# Patient Record
Sex: Female | Born: 1935 | Race: Black or African American | Hispanic: No | Smoking: Never smoker
Health system: Southern US, Community
[De-identification: ages and names within clinical notes are randomized; demographics above are authoritative.]

## PROBLEM LIST (undated history)

## (undated) DIAGNOSIS — F32A Depression, unspecified: Secondary | ICD-10-CM

## (undated) DIAGNOSIS — I10 Essential (primary) hypertension: Secondary | ICD-10-CM

## (undated) DIAGNOSIS — G4700 Insomnia, unspecified: Secondary | ICD-10-CM

## (undated) DIAGNOSIS — F329 Major depressive disorder, single episode, unspecified: Secondary | ICD-10-CM

## (undated) HISTORY — PX: ABDOMINAL HYSTERECTOMY: SHX81

## (undated) HISTORY — PX: CORNEAL TRANSPLANT: SHX108

## (undated) HISTORY — PX: TONSILLECTOMY: SUR1361

---

## 2017-05-26 ENCOUNTER — Encounter (HOSPITAL_BASED_OUTPATIENT_CLINIC_OR_DEPARTMENT_OTHER): Payer: Self-pay | Admitting: *Deleted

## 2017-05-26 ENCOUNTER — Emergency Department (HOSPITAL_BASED_OUTPATIENT_CLINIC_OR_DEPARTMENT_OTHER)
Admission: EM | Admit: 2017-05-26 | Discharge: 2017-05-28 | Disposition: A | Discharge status: Other Acute Inpatient Hospital | Payer: Medicare Other | Attending: Emergency Medicine | Admitting: Emergency Medicine

## 2017-05-26 DIAGNOSIS — F22 Delusional disorders: Secondary | ICD-10-CM | POA: Insufficient documentation

## 2017-05-26 DIAGNOSIS — I10 Essential (primary) hypertension: Secondary | ICD-10-CM | POA: Diagnosis not present

## 2017-05-26 DIAGNOSIS — Z79899 Other long term (current) drug therapy: Secondary | ICD-10-CM | POA: Insufficient documentation

## 2017-05-26 DIAGNOSIS — Z7982 Long term (current) use of aspirin: Secondary | ICD-10-CM | POA: Diagnosis not present

## 2017-05-26 DIAGNOSIS — R4182 Altered mental status, unspecified: Secondary | ICD-10-CM | POA: Diagnosis present

## 2017-05-26 HISTORY — DX: Insomnia, unspecified: G47.00

## 2017-05-26 HISTORY — DX: Essential (primary) hypertension: I10

## 2017-05-26 HISTORY — DX: Depression, unspecified: F32.A

## 2017-05-26 HISTORY — DX: Major depressive disorder, single episode, unspecified: F32.9

## 2017-05-26 LAB — CBC WITH DIFFERENTIAL/PLATELET
BASOS ABS: 0 10*3/uL (ref 0.0–0.1)
BASOS PCT: 0 %
Eosinophils Absolute: 0.1 10*3/uL (ref 0.0–0.7)
Eosinophils Relative: 1 %
HEMATOCRIT: 40.7 % (ref 36.0–46.0)
HEMOGLOBIN: 13.9 g/dL (ref 12.0–15.0)
LYMPHS PCT: 36 %
Lymphs Abs: 2.1 10*3/uL (ref 0.7–4.0)
MCH: 32.3 pg (ref 26.0–34.0)
MCHC: 34.2 g/dL (ref 30.0–36.0)
MCV: 94.4 fL (ref 78.0–100.0)
MONO ABS: 0.5 10*3/uL (ref 0.1–1.0)
MONOS PCT: 8 %
NEUTROS ABS: 3.2 10*3/uL (ref 1.7–7.7)
NEUTROS PCT: 55 %
Platelets: 189 10*3/uL (ref 150–400)
RBC: 4.31 MIL/uL (ref 3.87–5.11)
RDW: 13.4 % (ref 11.5–15.5)
WBC: 5.9 10*3/uL (ref 4.0–10.5)

## 2017-05-26 LAB — COMPREHENSIVE METABOLIC PANEL
ALBUMIN: 4.1 g/dL (ref 3.5–5.0)
ALK PHOS: 55 U/L (ref 38–126)
ALT: 10 U/L — ABNORMAL LOW (ref 14–54)
AST: 23 U/L (ref 15–41)
Anion gap: 6 (ref 5–15)
BILIRUBIN TOTAL: 0.7 mg/dL (ref 0.3–1.2)
BUN: 18 mg/dL (ref 6–20)
CALCIUM: 9.2 mg/dL (ref 8.9–10.3)
CO2: 27 mmol/L (ref 22–32)
CREATININE: 0.9 mg/dL (ref 0.44–1.00)
Chloride: 103 mmol/L (ref 101–111)
GFR calc Af Amer: >60 mL/min (ref >60)
GFR, EST NON AFRICAN AMERICAN: 57 mL/min — AB (ref >60)
GLUCOSE: 143 mg/dL — AB (ref 65–99)
POTASSIUM: 3.9 mmol/L (ref 3.5–5.1)
Sodium: 136 mmol/L (ref 135–145)
TOTAL PROTEIN: 7.3 g/dL (ref 6.5–8.1)

## 2017-05-26 LAB — TROPONIN I

## 2017-05-26 NOTE — ED Notes (Signed)
[facility] but unsuccessful

## 2017-05-26 NOTE — ED Triage Notes (Signed)
Daughter states altered mental status, paranoia, unsteady gait and generalized weakness  x 3 days

## 2017-05-27 ENCOUNTER — Emergency Department (HOSPITAL_BASED_OUTPATIENT_CLINIC_OR_DEPARTMENT_OTHER): Payer: Medicare Other

## 2017-05-27 LAB — RAPID URINE DRUG SCREEN, HOSP PERFORMED
AMPHETAMINES: NOT DETECTED
BENZODIAZEPINES: POSITIVE — AB
Barbiturates: NOT DETECTED
COCAINE: NOT DETECTED
OPIATES: NOT DETECTED
Tetrahydrocannabinol: NOT DETECTED

## 2017-05-27 LAB — URINALYSIS, ROUTINE W REFLEX MICROSCOPIC
Bilirubin Urine: NEGATIVE
Glucose, UA: NEGATIVE mg/dL
Hgb urine dipstick: NEGATIVE
Ketones, ur: NEGATIVE mg/dL
LEUKOCYTES UA: NEGATIVE
NITRITE: NEGATIVE
PROTEIN: NEGATIVE mg/dL
SPECIFIC GRAVITY, URINE: 1.015 (ref 1.005–1.030)
pH: 6 (ref 5.0–8.0)

## 2017-05-27 LAB — ETHANOL

## 2017-05-27 NOTE — ED Notes (Signed)
Pt is high risk to elopement.  Pt had ambulated to hallway.  Will place bed alarm

## 2017-05-27 NOTE — Progress Notes (Signed)
Patient meets criteria for inpatient treatment. CSW faxed referrals to the following inpatient facilities for review:  [facility], [facility], [facility], [facility], [facility], [facility], [facility]   TTS will continue to seek bed placement.   Princes Finger MSW, LCSWA CSW Disposition [phone]  

## 2017-05-27 NOTE — BHH Counselor (Signed)
Per Donell SievertSpencer Simon, PA-C: AM Psychriatric Evaluation recommended. Attending Provider, Judd Lienelo, MD notified at [phone].

## 2017-05-27 NOTE — Progress Notes (Signed)
Per Dr.Kumar, the patient meets criteria for inpatient gero-psych treatment. Recommends patient be referred to inpatient gero-psych treatment facilities.   Based on the collateral obtain during the [facility], given by the patient's son, Jarrett AblesClarence Hargett,  the patient has had an increase in anxiety, delusions, and inconsistent sleep patterns.   The patient's son also reports that he has witnessed observable changes in the patient's mental health status.   Patient was voluntarily brought to [facility] by her son Jarrett Ables(Clarence Woodford, [phone]) and daughter Gwynneth Aliment(Kimberly McNeil, [phone]). The patient reportedly lives alone.   TTS/Disposition CSW will seek possible placement for inpatient gero-psych treatment facilities.   Baldo DaubJolan Shirlie Enck MSW, LCSWA CSW Disposition [phone] (office) [phone] (cell)

## 2017-05-27 NOTE — ED Notes (Signed)
[facility] had large soft brown BM

## 2017-05-27 NOTE — ED Notes (Signed)
Patient transported to CT 

## 2017-05-27 NOTE — ED Notes (Signed)
[facility] to see if patient could be moved. Call will be made back to us if they are able to accept pt.
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
## 2017-05-27 NOTE — ED Notes (Signed)
Pt seen wandering in the the hallway. It took two staff members to encourage pt back to the room. Once in the room pt demaned to keep her medications with her in the bed. Her son not currently at the bedside. Pt was assisted back to bed and bed alarm was placed. Son back in room and was made aware of what had just occurred. He now has the package that her home medications are in. Pt agrees now to take daily bp, eye drop and asa pill.

## 2017-05-27 NOTE — ED Provider Notes (Signed)
[facility]-EMERGENCY DEPT [facility] Provider Note   CSN: [id number] Arrival date & time: 05/26/17  2144     History   Chief Complaint Chief Complaint  Patient presents with  . Altered Mental Status    HPI Trina AoMarie Bejarano is a 83 y.o. female.  Patient is an 83 year old female with past mental history of hypertension, depression, and insomnia. She is brought by family for evaluation of mental status change. According to the son and daughter at bedside, she has been paranoid and appears confused. The daughter first noticed this 3-4 days ago when she spoke with her on the phone. The daughter came to check on her and found her to be in the above state. The patient denies to me she is experiencing any pain, shortness of breath, or other symptoms. She does tell me that there is a "scheme in place to get her out of there", but will not elaborate on what this scheme is. She also tells me that she has money and believes that someone is trying to take this from her.  Patient has told the family that she may have taken extra Zoloft to "help her sleep".   The history is provided by the patient.  Altered Mental Status   This is a new problem. Episode onset: 3-4 days ago. The problem has been gradually worsening. Associated symptoms include confusion and delusions. Her past medical history does not include CVA or COPD.    Past Medical History:  Diagnosis Date  . Depression   . Hypertension   . Insomnia     There are no active problems to display for this patient.   Past Surgical History:  Procedure Laterality Date  . ABDOMINAL HYSTERECTOMY    . CORNEAL TRANSPLANT    . TONSILLECTOMY      OB History    No data available       Home Medications    Prior to Admission medications   Medication Sig Start Date End Date Taking? Authorizing Provider  aspirin 81 MG chewable tablet Chew by mouth daily.   Yes [provider]  felodipine (PLENDIL) 5 MG 24 hr tablet Take 5 mg by mouth daily.   Yes  [provider]  LORazepam (ATIVAN) 0.5 MG tablet Take 0.5 mg by mouth 2 (two) times daily.   Yes [provider]  Melatonin 10 MG TABS Take by mouth.   Yes [provider]  prednisoLONE acetate (PRED FORTE) 1 % ophthalmic suspension 1 drop 4 (four) times daily.   Yes [provider]  sertraline (ZOLOFT) 100 MG tablet Take 100 mg by mouth daily.   Yes [provider]    Family History History reviewed. No pertinent family history.  Social History Social History  Substance Use Topics  . Smoking status: Never Smoker  . Smokeless tobacco: Never Used  . Alcohol use No     Allergies   Amoxicillin and Codeine   Review of Systems Review of Systems  Psychiatric/Behavioral: Positive for confusion.  All other systems reviewed and are negative.    Physical Exam Updated Vital Signs BP (!) 144/69   Pulse (!) 53   Temp 98 F (36.7 C) (Oral)   Resp 17   Ht 5\' 7"  (1.702 m)   Wt 77.1 kg (170 lb)   SpO2 97%   BMI 26.63 kg/m   Physical Exam  Constitutional: She is oriented to person, place, and time. She appears well-developed and well-nourished. No distress.  HENT:  Head: Normocephalic and atraumatic.  Mouth/Throat: Oropharynx is clear and moist.  Eyes: Pupils are equal, round, and reactive to light. EOM are normal.  Neck: Normal range of motion. Neck supple.  Cardiovascular: Normal rate and regular rhythm.  Exam reveals no gallop and no friction rub.   No murmur heard. Pulmonary/Chest: Effort normal and breath sounds normal. No respiratory distress. She has no wheezes.  Abdominal: Soft. Bowel sounds are normal. She exhibits no distension. There is no tenderness.  Musculoskeletal: Normal range of motion.  Lymphadenopathy:    She has no cervical adenopathy.  Neurological: She is alert and oriented to person, place, and time. No cranial nerve deficit. She exhibits normal muscle tone. Coordination normal.  Skin: Skin is warm and dry.  She is not diaphoretic.  Nursing note and vitals reviewed.    ED Treatments / Results  Labs (all labs ordered are listed, but only abnormal results are displayed) Labs Reviewed  COMPREHENSIVE METABOLIC PANEL - Abnormal; Notable for the following:       Result Value   Glucose, Bld 143 (*)    ALT 10 (*)    GFR calc non Af Amer 57 (*)    All other components within normal limits  CBC WITH DIFFERENTIAL/PLATELET  TROPONIN I  URINALYSIS, ROUTINE W REFLEX MICROSCOPIC    EKG  EKG Interpretation None       Radiology No results found.  Procedures Procedures (including critical care time)  Medications Ordered in ED Medications - No data to display   Initial Impression / Assessment and Plan / ED Course  I have reviewed the triage vital signs and the nursing notes.  Pertinent labs & imaging results that were available during my care of the patient were reviewed by me and considered in my medical decision making (see chart for details).  Patient brought by family for evaluation of paranoid and delusional behavior as described in the history of present illness. She does have a history of depression and difficulty sleeping. Family also reports that she has had a "nervous breakdown" in the past.  Her medical workup is essentially unremarkable. She is afebrile, has no white count, there is no sign of infection. Laboratory studies are also unremarkable. Head CT is negative and urinalysis is clear. The patient has undergone consultation by TTS who is recommending reevaluation in the morning. She will remain in the emergency department until that time. The final disposition will be left to TTS discretion.  She appears to be medically cleared.  Final Clinical Impressions(s) / ED Diagnoses   Final diagnoses:  None    New Prescriptions New Prescriptions   No medications on file     Geoffery Lyons, MD 05/27/17 [phone]

## 2017-05-27 NOTE — ED Notes (Signed)
[facility]

## 2017-05-27 NOTE — ED Notes (Signed)
Assisted RN Merry ProudBrandi in taking Pt from the bed to the commode and then onto the recliner chair.

## 2017-05-27 NOTE — ED Notes (Signed)
Family member brought food for patient. Pt calm and relaxed.

## 2017-05-27 NOTE — ED Notes (Signed)
Cheese and crackers given to pt  

## 2017-05-27 NOTE — ED Notes (Signed)
Pt received Prednisolone eye drops in both eyes and 1 2.5 of her BP medication.

## 2017-05-27 NOTE — ED Notes (Signed)
TTS consult in progress, son at bedside

## 2017-05-27 NOTE — Progress Notes (Signed)
CSW spoke with [facility] End[facility]Kelly at [facility] and requested to speak with the patient's nurse, Mauricio PoBrandi Leak, RN.   Merry ProudBrandi, RN was busy assisting another patient.  CSW left a message with Tresa EndoKelly informing them that the patient is recommended for inpatient gero-psych treatment and that TTS will begin seeking possible placement.   Tresa EndoKelly agreed to notify Mauricio PoBrandi Leak, RN.   Baldo DaubJolan Estha Few MSW, LCSWA CSW Disposition [phone]

## 2017-05-28 MED ORDER — ACETAMINOPHEN 500 MG PO TABS
1000.0000 mg | ORAL_TABLET | Freq: Once | ORAL | Status: AC
  Administered 2017-05-28: 1000 mg via ORAL
  Filled 2017-05-28: qty 2

## 2017-05-28 NOTE — ED Notes (Signed)
Per Ala DachFord with [facility], the POA will need to be faxed to [phone], attention Victorino DikeJennifer and then bed assignment will be given.

## 2017-05-28 NOTE — ED Provider Notes (Signed)
[facility]. Stable for transport.    Danielle Bird, Danielle Cisek, MD 05/28/17 [phone]

## 2017-05-28 NOTE — ED Notes (Signed)
Spoke with Danielle Bird at [facility]. Confirms she received fax with pt's HCPOA. She also spoke with pt's daughter Danielle Bird to obtain consent. Accepting MD Merilyn BabaUreh [place]. Dr Adela LankFloyd notified. States pt to go to main lobby to check in unless she arrives after 5pm in which case they need to go to the ED. Pt's son Danielle Bird at bedside updated

## 2017-05-28 NOTE — BH Assessment (Signed)
Spoke to StapletonJennifer at [facility]. She asked that healthcare POA be faxed to [phone]. They will give transfer information after they receive the fax. Notified Bryna ColanderSophie Gouge, Charity fundraiserN at [facility].   Harlin RainFord Ellis Patsy BaltimoreWarrick Jr, LPC, [facility], [facility] Triage Specialist [phone]
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
## 2017-05-28 NOTE — ED Notes (Signed)
Per TTS Henri MedalMary, [facility] will review pts chart for placement in the morning

## 2017-05-28 NOTE — ED Provider Notes (Signed)
6:52 AM Patient rounded on this morning. She is up and ambulatory. No complaints. She continues to appear confused. Per TTS, [facility] has been accepted [facility] per involuntary paperwork being signed as she cannot sign herself in. I have inquired whether her family can sign her in. Patient is not a danger to herself at this time and I do not feel IVC paperwork is appropriate. She does have a power of attorney. Power of attorney is willing to sign her in. When this paperwork as faxed, I am told that patient will be accepted [facility].   Shon BatonHorton, Daeveon Zweber F, MD 05/28/17 [phone]

## 2017-05-28 NOTE — BHH Counselor (Signed)
[facility] and spoke with Diane. Advised that [facility] will not be making a disposition decision on pt until later this morning. Victorino DikeJennifer in [facility] stated they will let us know the outcome.

## 2017-05-28 NOTE — BH Assessment (Signed)
Received call from staff at [facility] who said they have a bed available for Pt but feel she is unable to sign voluntary due to mental status. They will accept Pt if she is placed under IVC. Not[facility] staff [facility].   Harlin RainFord Ellis Patsy BaltimoreWarrick Jr, LPC, [facility], [facility] Triage Specialist [phone]

## 2017-05-28 NOTE — Progress Notes (Signed)
CSW received a phone call from [facility]. Per Delorise ShinerGrace, she has not received the patient's Healthcare POA paperwork, which is required for the patient to be officially accepted.   Per Delorise ShinerGrace, they will hold the patient's bed today while waiting on paperwork to be faxed.   CSW spoke with the patient's nurse, Amy, RN regarding healthcare POA paperwork. Per Amy, RN, the patient's son is at beside and has stated that his sister, the patient's daughter is bringing the paperwork to the facility as soon as possible.   Amy, RN stated once paperwork is received, she will fax it to [facility] at [phone].   CSW will continue to follow and assist.   Baldo DaubJolan Merilynn Haydu MSW, LCSWA CSW Disposition [phone]

## 2017-05-28 NOTE — BHH Counselor (Signed)
Called [facility] to follow-up on referral sent for review for IP admission. Spoke with DIRECTVJennifer. Per Victorino DikeJennifer, pt disposition will be decided in the morning. They will contact us after decision is made.

## 2017-05-28 NOTE — ED Notes (Signed)
Report given to Amy RN.

## 2017-05-28 NOTE — ED Notes (Signed)
Sitter at bedside, as well as son. Daughter, Gwynneth AlimentKimberly McNeil to return to ED with copy of healthcare POA, and informed son,  that has been [facility] and Cala BradfordKimberly will need to go to [facility] for signature for admission . Awaiting further call from [facility], regarding accepting MD and time for transfer to facility

## 2017-08-29 ENCOUNTER — Ambulatory Visit (HOSPITAL_COMMUNITY)
Admission: EM | Admit: 2017-08-29 | Discharge: 2017-08-29 | Disposition: A | Discharge status: Home / Self Care | Payer: Medicare Other | Attending: Emergency Medicine | Admitting: Emergency Medicine

## 2017-08-29 ENCOUNTER — Encounter (HOSPITAL_COMMUNITY): Payer: Self-pay | Admitting: Emergency Medicine

## 2017-08-29 DIAGNOSIS — S60468A Insect bite (nonvenomous) of other finger, initial encounter: Secondary | ICD-10-CM | POA: Diagnosis not present

## 2017-08-29 DIAGNOSIS — T63441A Toxic effect of venom of bees, accidental (unintentional), initial encounter: Secondary | ICD-10-CM | POA: Diagnosis not present

## 2017-08-29 DIAGNOSIS — L500 Allergic urticaria: Secondary | ICD-10-CM

## 2017-08-29 MED ORDER — METHYLPREDNISOLONE ACETATE 40 MG/ML IJ SUSP
20.0000 mg | Freq: Once | INTRAMUSCULAR | Status: AC
  Administered 2017-08-29: 20 mg via INTRAMUSCULAR

## 2017-08-29 MED ORDER — DEXAMETHASONE SODIUM PHOSPHATE 10 MG/ML IJ SOLN
10.0000 mg | Freq: Once | INTRAMUSCULAR | Status: AC
  Administered 2017-08-29: 10 mg via INTRAMUSCULAR

## 2017-08-29 MED ORDER — DEXAMETHASONE SODIUM PHOSPHATE 10 MG/ML IJ SOLN
INTRAMUSCULAR | Status: AC
  Filled 2017-08-29: qty 1

## 2017-08-29 MED ORDER — PREDNISONE 20 MG PO TABS: ORAL_TABLET | ORAL | 0 refills | Status: AC

## 2017-08-29 MED ORDER — METHYLPREDNISOLONE ACETATE 40 MG/ML IJ SUSP
INTRAMUSCULAR | Status: AC
  Filled 2017-08-29: qty 1

## 2017-08-29 NOTE — Discharge Instructions (Signed)
Take the Benadryl 50 mg every 4-6 hours. Tomorrow start the prednisone as directed. It is only for 2 days. Take with food. If you are getting worse develop new symptoms or problems may return. Apply cold compresses to the thumb to help the swelling itching or burning at the bee sting site.

## 2017-08-29 NOTE — ED Triage Notes (Signed)
PT C/O: bee sting .... Reports she tried to p/u what she thought was a leaf and the bee stung her .... Hx of bee allergies  ONSET: 12/30  SX ALSO INCLUDE: rash all over body  DENIES: SOB, dyspnea, dysphagia   TAKING MEDS: benadryl   A&O x4... NAD... Ambulatory

## 2017-08-29 NOTE — ED Provider Notes (Signed)
[facility]    CSN: [id number] Arrival date & time: 08/29/17  1323     History   Chief Complaint Chief Complaint  Patient presents with  . Insect Bite    HPI Danielle Bird is a 83 y.o. female.    83 year old female with a wasp sting to the left thumb. This occurred around noon today. Approximately 15 minutes later she took 50 mg of Benadryl. She presents with a complaint of pruritic urticaria generalized. Denies swelling, problems swallowing or breathing.      Past Medical History:  Diagnosis Date  . Depression   . Hypertension   . Insomnia     There are no active problems to display for this patient.   Past Surgical History:  Procedure Laterality Date  . ABDOMINAL HYSTERECTOMY    . CORNEAL TRANSPLANT    . TONSILLECTOMY      OB History    No data available       Home Medications    Prior to Admission medications   Medication Sig Start Date End Date Taking? Authorizing Provider  aspirin 81 MG chewable tablet Chew by mouth daily.   Yes [provider]  LORazepam (ATIVAN) 0.5 MG tablet Take 0.5 mg by mouth 2 (two) times daily.   Yes [provider]  Melatonin 10 MG TABS Take by mouth.   Yes [provider]  sertraline (ZOLOFT) 100 MG tablet Take 100 mg by mouth daily.   Yes [provider]  felodipine (PLENDIL) 5 MG 24 hr tablet Take 5 mg by mouth daily.    [provider]  prednisoLONE acetate (PRED FORTE) 1 % ophthalmic suspension 1 drop 4 (four) times daily.    [provider]  predniSONE (DELTASONE) 20 MG tablet 2 tabs po once daily x 2 days. Start 08/29/2017 08/29/17   Hayden RasmussenMabe, Kimisha Eunice, NP    Family History History reviewed. No pertinent family history.  Social History Social History   Tobacco Use  . Smoking status: Never Smoker  . Smokeless tobacco: Never Used  Substance Use Topics  . Alcohol use: No  . Drug use: No     Allergies   Amoxicillin; Bee venom; and Codeine   Review of  Systems Review of Systems  Constitutional: Negative.  Negative for fever.  HENT: Negative.   Respiratory: Negative.   Gastrointestinal: Negative.   Skin: Positive for rash.  All other systems reviewed and are negative.    Physical Exam Triage Vital Signs ED Triage Vitals [08/29/17 1326]  Enc Vitals Group     BP (!) 149/64     Pulse Rate 86     Resp 20     Temp (!) 97.4 F (36.3 C)     Temp Source Oral     SpO2 100 %     Weight      Height      Head Circumference      Peak Flow      Pain Score      Pain Loc      Pain Edu?      Excl. in GC?    No data found.  Updated Vital Signs BP (!) 149/64 (BP Location: Left Arm)   Pulse 86   Temp (!) 97.4 F (36.3 C) (Oral)   Resp 20   SpO2 100%   Visual Acuity Right Eye Distance:   Left Eye Distance:   Bilateral Distance:    Right Eye Near:   Left Eye Near:  Bilateral Near:     Physical Exam  Constitutional: She is oriented to person, place, and time. She appears well-developed and well-nourished. No distress.  HENT:  Mouth/Throat: Oropharynx is clear and moist.  No intraoral edema. No swelling. Airway widely patent. No stridor.  Eyes: EOM are normal.  Neck: Normal range of motion. Neck supple.  Cardiovascular: Normal rate, regular rhythm and normal heart sounds.  Pulmonary/Chest: Effort normal and breath sounds normal. She has no wheezes. She has no rales.  Musculoskeletal: She exhibits no edema.  Neurological: She is alert and oriented to person, place, and time. She exhibits normal muscle tone.  Skin: Skin is warm and dry.  Psychiatric: She has a normal mood and affect.  Nursing note and vitals reviewed.    UC Treatments / Results  Labs (all labs ordered are listed, but only abnormal results are displayed) Labs Reviewed - No data to display  EKG  EKG Interpretation None       Radiology No results found.  Procedures Procedures (including critical care time)  Medications Ordered in  UC Medications  dexamethasone (DECADRON) injection 10 mg (10 mg Intramuscular Given 08/29/17 1353)  methylPREDNISolone acetate (DEPO-MEDROL) injection 20 mg (20 mg Intramuscular Given 08/29/17 1352)     Initial Impression / Assessment and Plan / UC Course  I have reviewed the triage vital signs and the nursing notes.  Pertinent labs & imaging results that were available during my care of the patient were reviewed by me and considered in my medical decision making (see chart for details).    Take the Benadryl 50 mg every 4-6 hours. Tomorrow start the prednisone as directed. It is only for 2 days. Take with food. If you are getting worse develop new symptoms or problems may return. Apply cold compresses to the thumb to help the swelling itching or burning at the bee sting site. Approximately 30 minutes after the injections the patient states she is feeling much better, no more itching. No swelling no trouble breathing.    Final Clinical Impressions(s) / UC Diagnoses   Final diagnoses:  Bee sting, accidental or unintentional, initial encounter  Allergic urticaria    ED Discharge Orders        Ordered    predniSONE (DELTASONE) 20 MG tablet     08/29/17 1448       Controlled Substance Prescriptions [place] Controlled Substance Registry consulted? Not Applicable   Hayden RasmussenMabe, Sanyiah Kanzler, NP 08/29/17 1452

## 2017-08-29 NOTE — ED Notes (Signed)
Notified Teena Iraniavid M, NP on status of pt.

## 2019-04-24 IMAGING — CT CT HEAD W/O CM
3 series · 14 of 47 positions shown, 16 images · non-contrast
Comparison: None.

CLINICAL DATA: Altered mental status, unsteady gait and weakness
for 3 days. History of hypertension.

EXAM:
CT HEAD WITHOUT CONTRAST
TECHNIQUE: Contiguous axial images were obtained from the base of the skull
through the vertex without intravenous contrast.

[Series 2: head wo · axial · 0.47mm/px · z∈[-166,-26]mm · 8 of 34 slices shown, 10 images]
[im 3/34  brain]
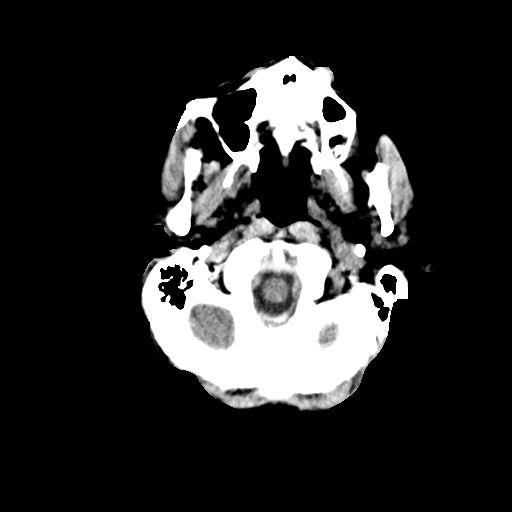
[im 3/34  bone]
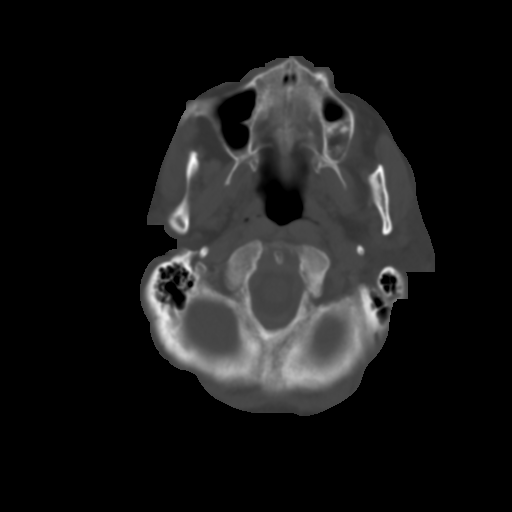
[im 7/34  brain]
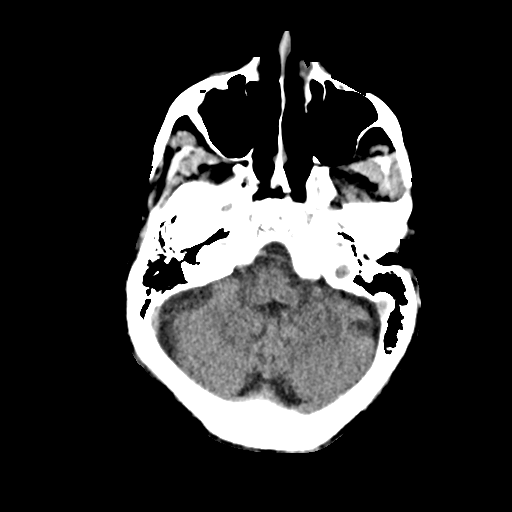
[im 11/34  brain]
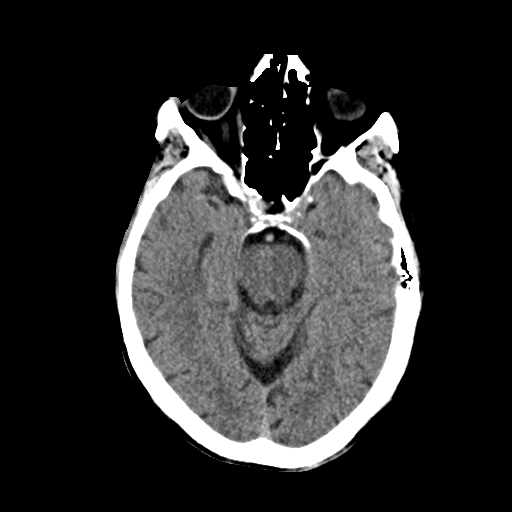
[im 15/34  brain]
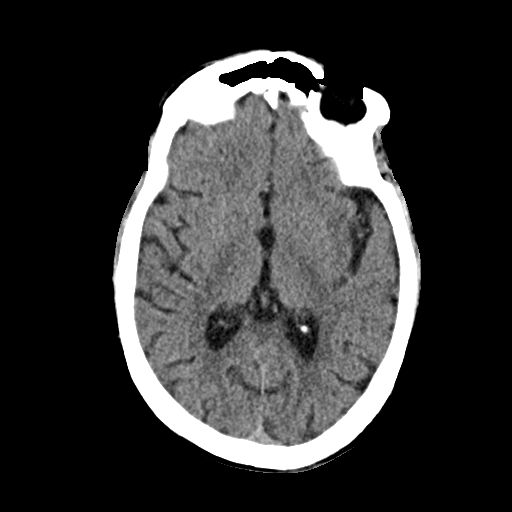
[im 19/34  brain]
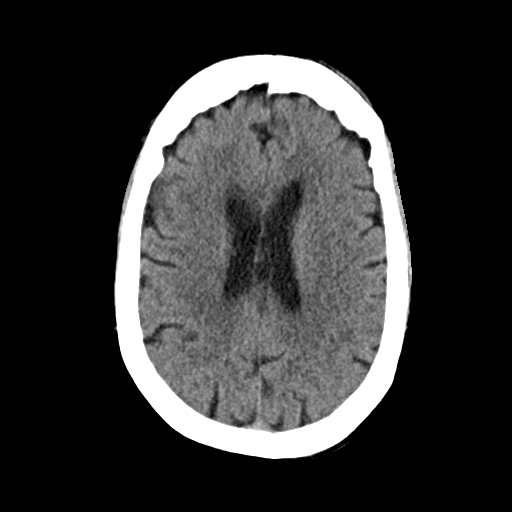
[im 19/34  bone]
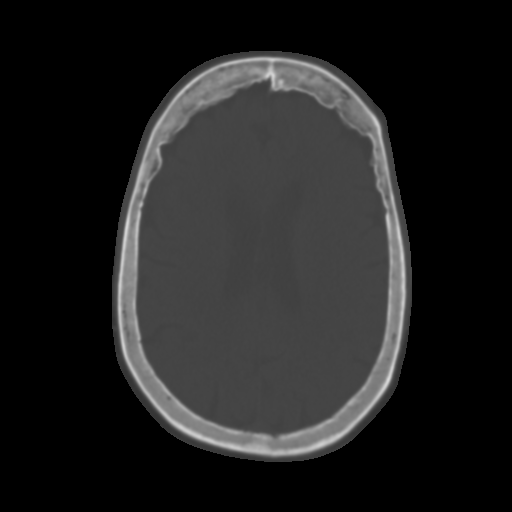
[im 23/34  brain]
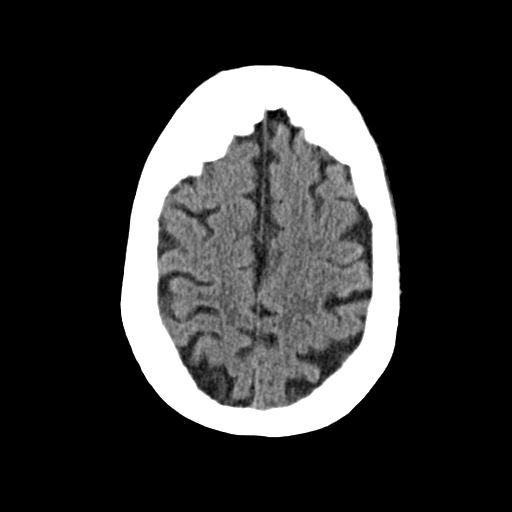
[im 27/34  brain]
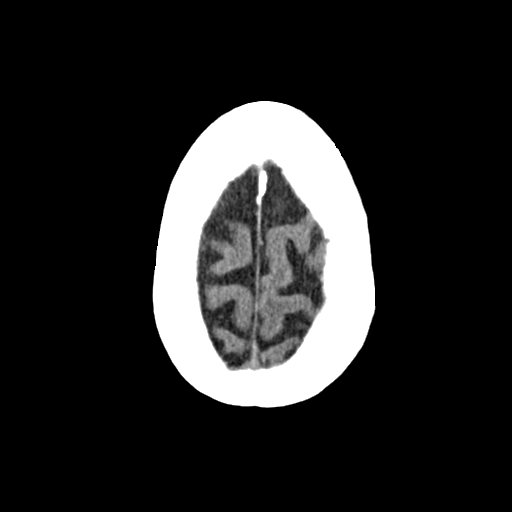
[im 31/34  brain]
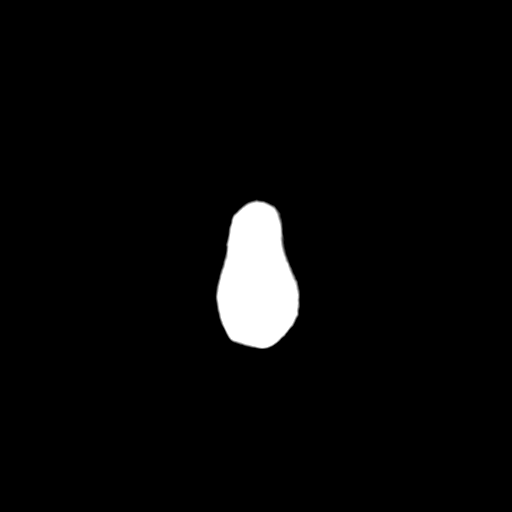

[Series 4: coronal soft · coronal · 0.32mm/px · 3 of 71 slices shown]
[im 24/71  brain]
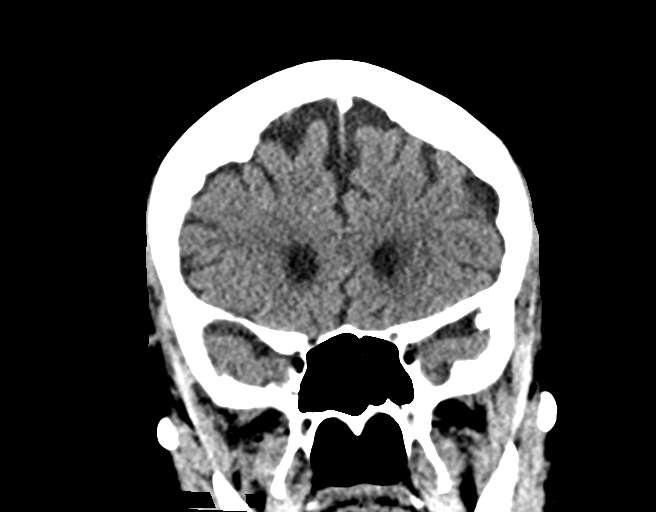
[im 32/71  brain]
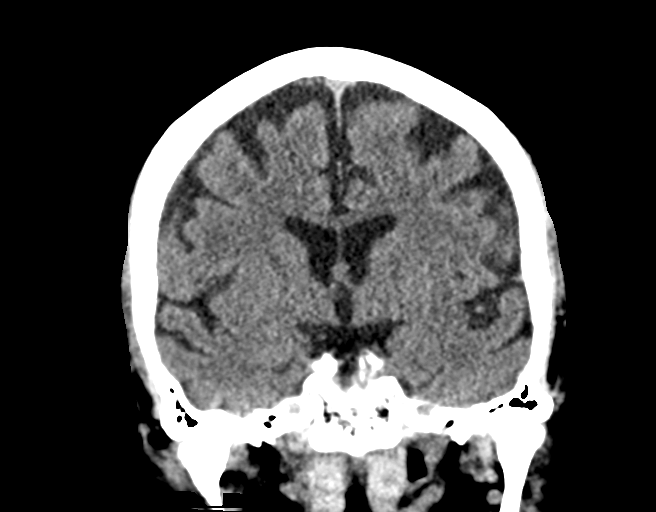
[im 39/71  brain]
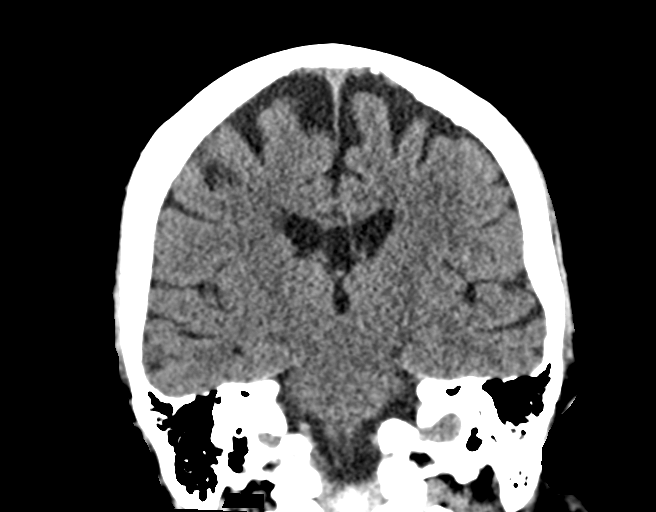

[Series 5: sag soft · sagittal · 0.33mm/px · 3 of 54 slices shown]
[im 18/54  brain]
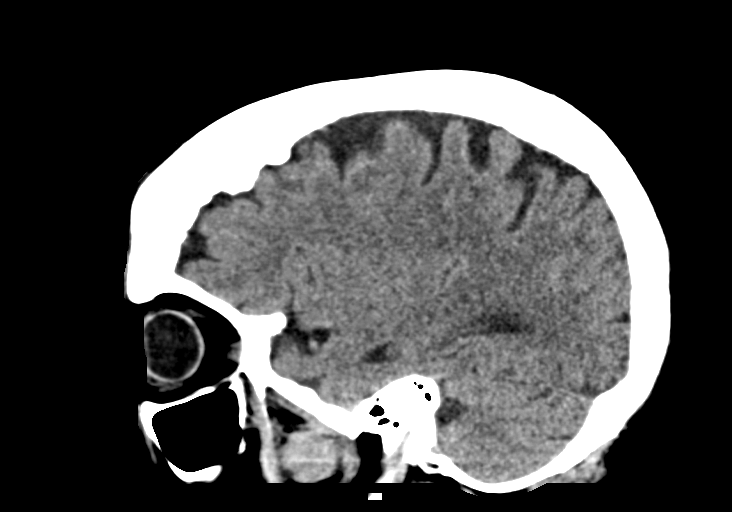
[im 27/54  brain]
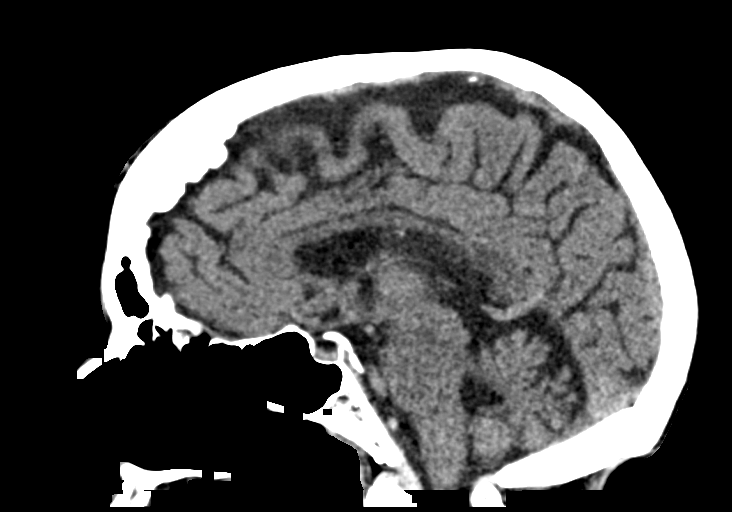
[im 36/54  brain]
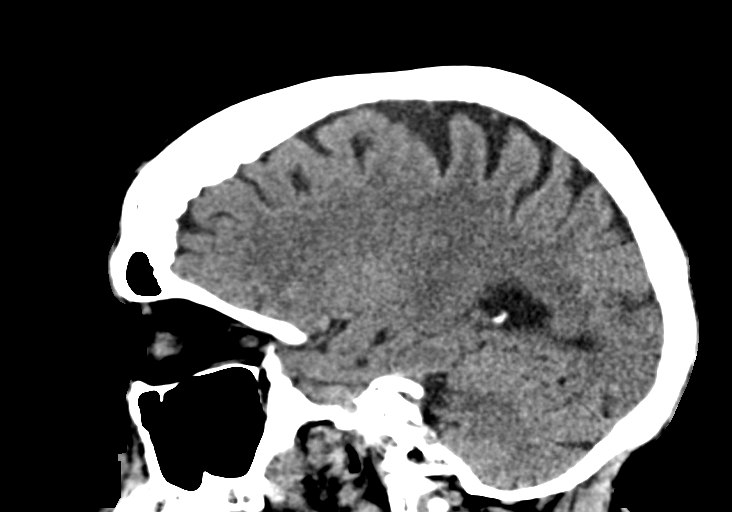

[14 of 47 positions shown; findings below may reference images not displayed]

FINDINGS: BRAIN: No intraparenchymal hemorrhage, mass effect nor midline
shift. The ventricles and sulci are normal for age, cavum velum
interpositum with marginal calcification. Patchy supratentorial
white matter hypodensities less than expected for patient's age,
though non-specific are most compatible with chronic small vessel
ischemic disease. No acute large vascular territory infarcts. No
abnormal extra-axial fluid collections. Basal cisterns are patent.

VASCULAR: Mild calcific atherosclerosis of the carotid siphons.

SKULL: No skull fracture. No significant scalp soft tissue swelling.

SINUSES/ORBITS: The mastoid air-cells and included paranasal sinuses
are well-aerated.The included ocular globes and orbital contents are
non-suspicious. Status post bilateral ocular lens implants.

OTHER: None.
IMPRESSION: Negative noncontrast CT HEAD for age.
